# Patient Record
Sex: Female | Born: 1985 | Race: White | Hispanic: No | Marital: Married | Smoking: Never smoker
Health system: Southern US, Community
[De-identification: ages and names within clinical notes are randomized; demographics above are authoritative.]

## PROBLEM LIST (undated history)

## (undated) DIAGNOSIS — L719 Rosacea, unspecified: Secondary | ICD-10-CM

## (undated) DIAGNOSIS — M1990 Unspecified osteoarthritis, unspecified site: Secondary | ICD-10-CM

## (undated) DIAGNOSIS — E079 Disorder of thyroid, unspecified: Secondary | ICD-10-CM

## (undated) DIAGNOSIS — K900 Celiac disease: Secondary | ICD-10-CM

## (undated) DIAGNOSIS — F909 Attention-deficit hyperactivity disorder, unspecified type: Secondary | ICD-10-CM

## (undated) HISTORY — DX: Celiac disease: K90.0

## (undated) HISTORY — DX: Attention-deficit hyperactivity disorder, unspecified type: F90.9

## (undated) HISTORY — DX: Rosacea, unspecified: L71.9

## (undated) HISTORY — DX: Unspecified osteoarthritis, unspecified site: M19.90

## (undated) HISTORY — DX: Disorder of thyroid, unspecified: E07.9

## (undated) HISTORY — PX: GANGLION CYST EXCISION: SHX1691

---
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
## 2007-02-13 ENCOUNTER — Emergency Department (HOSPITAL_COMMUNITY): Admission: EM | Admit: 2007-02-13 | Discharge: 2007-02-13 | Payer: Self-pay | Admitting: Emergency Medicine

## 2007-02-25 ENCOUNTER — Ambulatory Visit: Payer: Self-pay | Admitting: Internal Medicine

## 2007-02-25 DIAGNOSIS — R079 Chest pain, unspecified: Secondary | ICD-10-CM | POA: Insufficient documentation

## 2007-02-25 DIAGNOSIS — R05 Cough: Secondary | ICD-10-CM | POA: Insufficient documentation

## 2007-03-24 ENCOUNTER — Ambulatory Visit: Payer: Self-pay | Admitting: Internal Medicine

## 2007-03-24 DIAGNOSIS — R0602 Shortness of breath: Secondary | ICD-10-CM | POA: Insufficient documentation

## 2007-03-29 ENCOUNTER — Telehealth (INDEPENDENT_AMBULATORY_CARE_PROVIDER_SITE_OTHER): Payer: Self-pay | Admitting: *Deleted

## 2007-04-01 ENCOUNTER — Encounter: Payer: Self-pay | Admitting: Internal Medicine

## 2007-04-01 ENCOUNTER — Ambulatory Visit (HOSPITAL_COMMUNITY): Admission: RE | Admit: 2007-04-01 | Discharge: 2007-04-01 | Payer: Self-pay | Admitting: Internal Medicine

## 2007-04-06 ENCOUNTER — Telehealth (INDEPENDENT_AMBULATORY_CARE_PROVIDER_SITE_OTHER): Payer: Self-pay | Admitting: *Deleted

## 2007-04-15 ENCOUNTER — Ambulatory Visit: Payer: Self-pay | Admitting: Internal Medicine

## 2007-04-15 DIAGNOSIS — J45909 Unspecified asthma, uncomplicated: Secondary | ICD-10-CM | POA: Insufficient documentation

## 2007-05-27 ENCOUNTER — Ambulatory Visit: Payer: Self-pay | Admitting: Internal Medicine

## 2007-07-05 ENCOUNTER — Ambulatory Visit: Payer: Self-pay | Admitting: Internal Medicine

## 2007-07-05 DIAGNOSIS — K219 Gastro-esophageal reflux disease without esophagitis: Secondary | ICD-10-CM | POA: Insufficient documentation

## 2008-02-03 ENCOUNTER — Telehealth: Payer: Self-pay | Admitting: Internal Medicine

## 2008-05-07 ENCOUNTER — Ambulatory Visit: Payer: Self-pay | Admitting: Adult Health

## 2008-07-09 ENCOUNTER — Ambulatory Visit: Payer: Self-pay | Admitting: Internal Medicine

## 2008-08-09 ENCOUNTER — Telehealth (INDEPENDENT_AMBULATORY_CARE_PROVIDER_SITE_OTHER): Payer: Self-pay | Admitting: *Deleted

## 2008-09-06 ENCOUNTER — Telehealth: Payer: Self-pay | Admitting: Internal Medicine

## 2008-09-18 ENCOUNTER — Encounter: Payer: Self-pay | Admitting: Internal Medicine

## 2009-09-20 ENCOUNTER — Ambulatory Visit: Payer: Self-pay | Admitting: Advanced Practice Midwife

## 2009-09-20 ENCOUNTER — Inpatient Hospital Stay (HOSPITAL_COMMUNITY)
Admission: AD | Admit: 2009-09-20 | Discharge: 2009-09-20 | Payer: Self-pay | Source: Home / Self Care | Admitting: Obstetrics & Gynecology

## 2009-10-16 IMAGING — CR DG CHEST 2V
2 series · 2 of 2 positions shown · non-contrast
Comparison: none

CLINICAL DATA: Trouble breathing.  
 CHEST ? 2 VIEW:

[w chest pa]
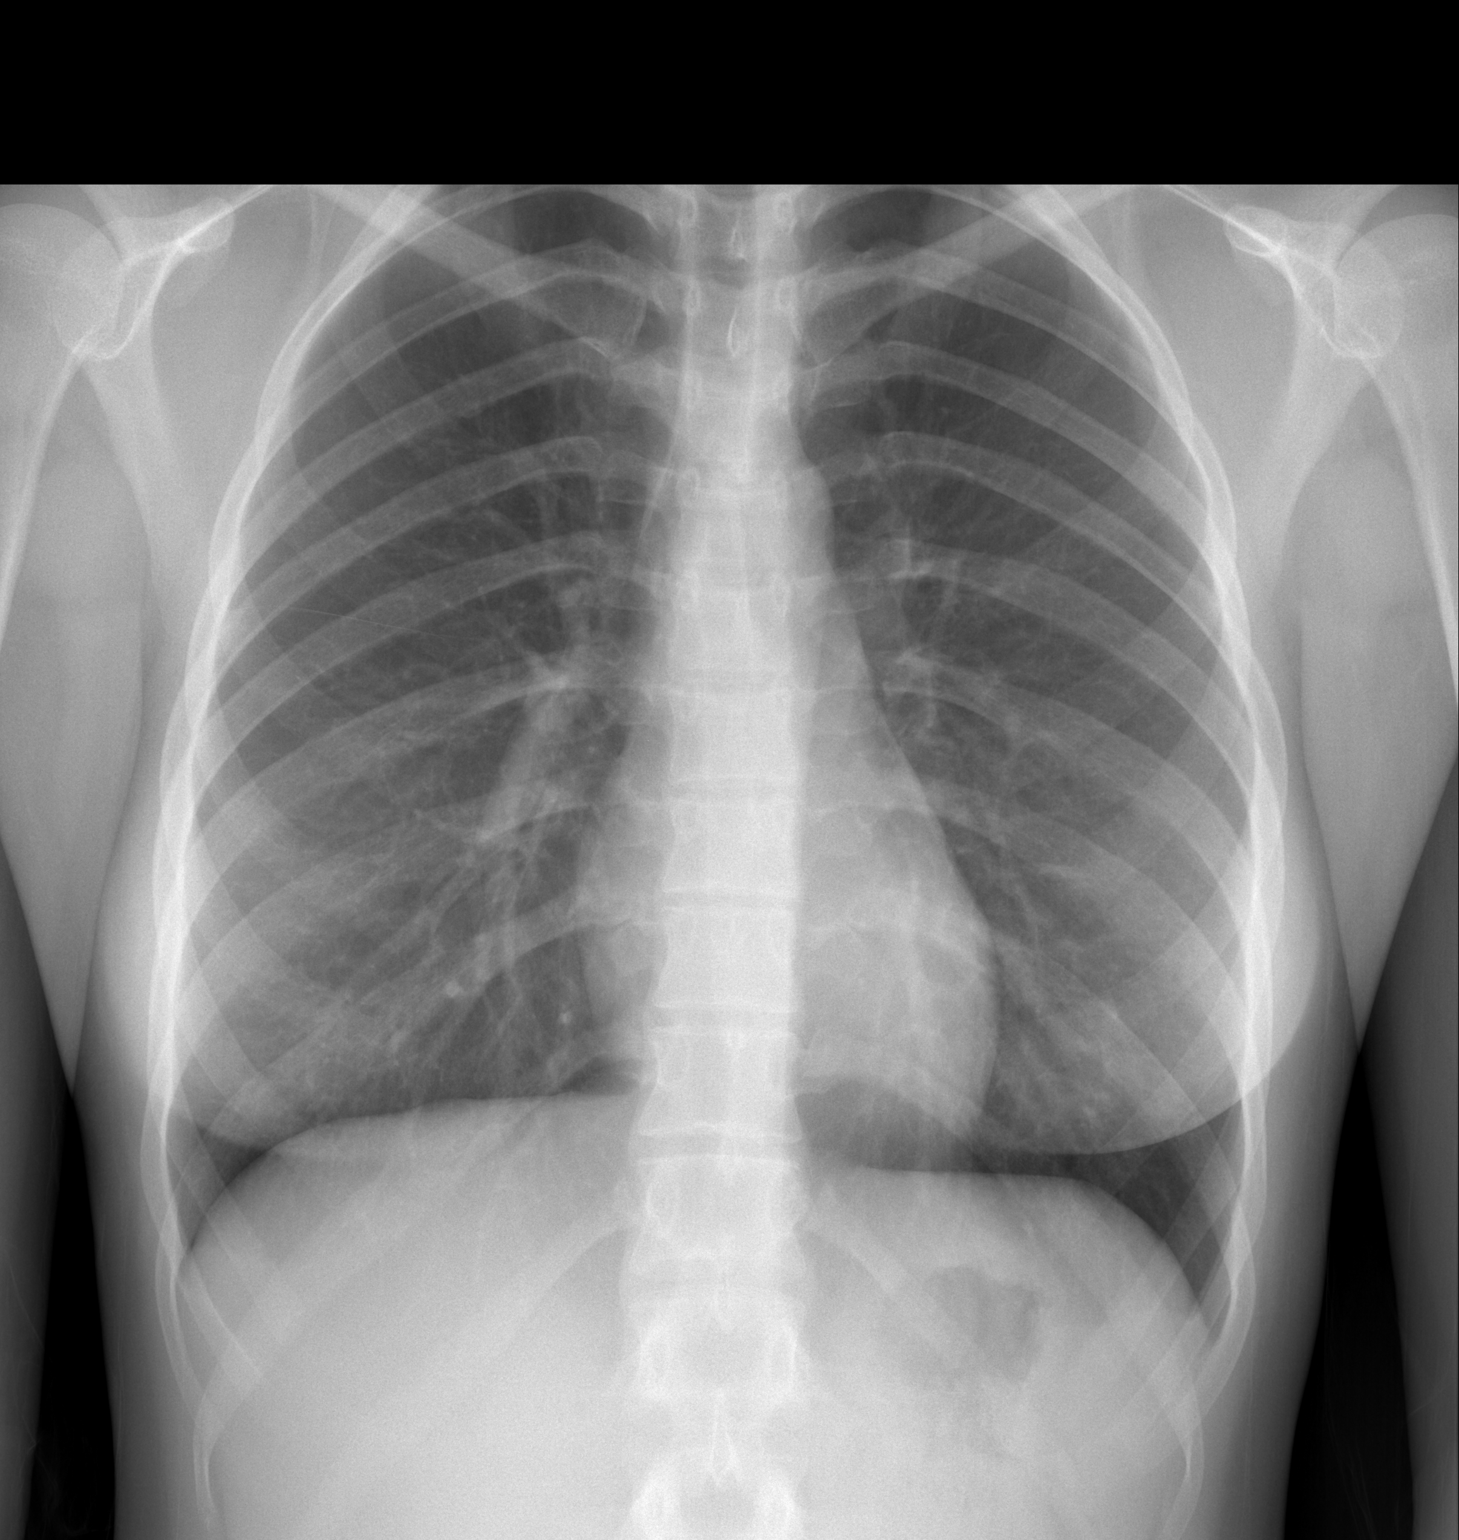

[w chest lat]
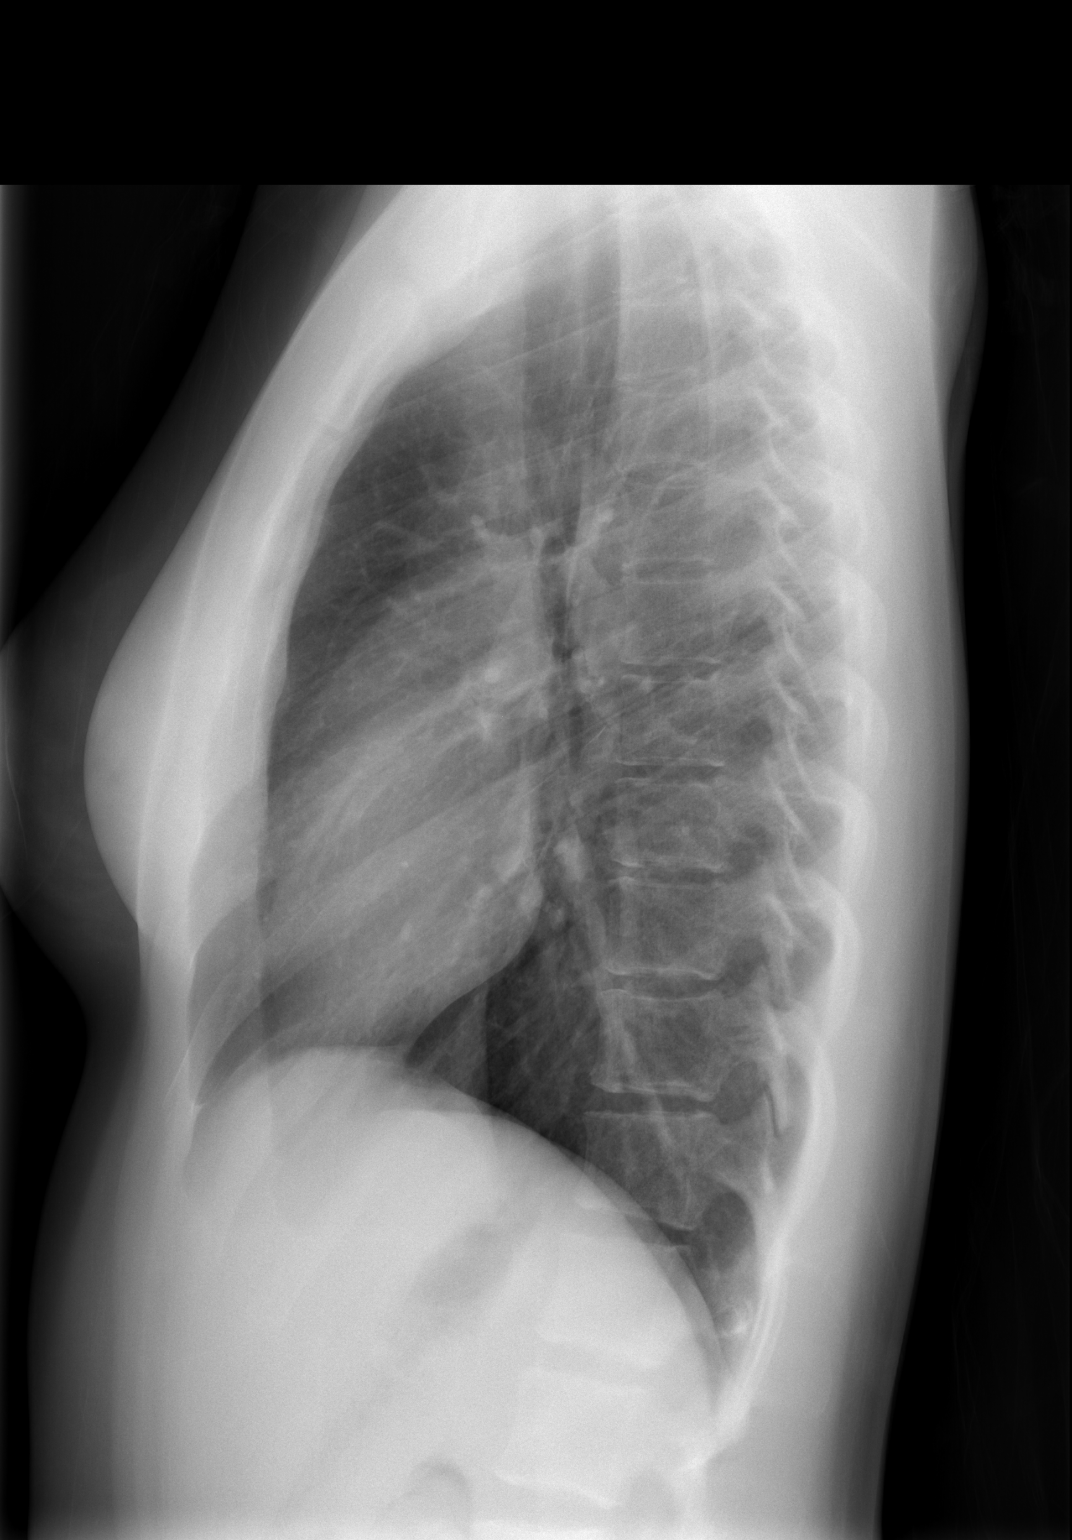

[2 of 2 positions shown; findings below may reference images not displayed]

FINDINGS: Normal mediastinum and cardiac silhouette.  The costophrenic angles are clear.  Normal pulmonary vasculature.  No effusion, infiltrate or pneumothorax.
IMPRESSION: No acute cardiopulmonary process.

## 2010-07-18 NOTE — Op Note (Signed)
Molly Todd, Molly Todd              ACCOUNT NO.:  [id number]   MEDICAL RECORD NO.:  [id number]          PATIENT TYPE:  AMB   LOCATION:  DSC                          FACILITY:  [facility]   PHYSICIAN:  Lubertha Basque. Dalldorf, M.D.DATE OF BIRTH:  03/11/1985   DATE OF PROCEDURE:  08/19/2004  DATE OF DISCHARGE:                                 OPERATIVE REPORT   PREOPERATIVE DIAGNOSIS:  Left wrist cyst.   POSTOPERATIVE DIAGNOSIS:  Left wrist cyst.   PROCEDURE:  Excision cyst left wrist.   ANESTHESIA:  General.   ATTENDING SURGEON:  Lubertha Basque. Jerl Santos, M.D.   INDICATIONS:  The patient is a 25 year old woman with a long history of a  cyst on the dorsal aspect of her left wrist. This persisted despite a brace  and observation. This causes her discomfort and limits her motion somewhat  and she would like to have it removed. She is offered an excision. After  informed operative consent was obtained along with discussion of possible  complications of reaction to anesthesia, infection, neurovascular injury and  recurrence.   DESCRIPTION OF PROCEDURE:  The patient was taken to the operating suite  where general anesthetic was applied without difficulty. She was positioned  supine and prepped and draped in normal sterile fashion. After  administration with preoperative IV antibiotics, the left arm was elevated,  exsanguinated, tourniquet inflated about her upper arm. A small transverse  incision was made over the cyst with dissection down the structure. She had  multiloculated cyst filled with gelatinous fluid consistent with a ganglion.  This was excised along with a long stalk that went down to the dorsal  capsule. The entire stalk plus a portion of the dorsal capsule was excised,  sent to pathology. The wound was irrigated followed by release of  tourniquet. The skin edges all became pink and warm immediately. A small  amount bleeding was easily controlled with pressure. Marcaine was injected  about  the incision site followed by closure of skin alone with vertical  mattresses of nylon. Adaptic was applied over the wound followed by dry  gauze and a volar splint of plaster with wrist in slight extension.  Estimated blood loss and fluids can be obtained from anesthesia records as  can accurate tourniquet time.   DISPOSITION:  The patient was extubated in operating room and taken to  recovery room in stable condition.   PLANS:  Were for her to go home the same day and follow up in the office in  less than a week. I will contact her by phone tonight.     PGD/MEDQ  D:  08/19/2004  T:  08/19/2004  Job:  [id number]

## 2010-12-08 LAB — D-DIMER, QUANTITATIVE: D-Dimer, Quant: <0.22

## 2010-12-08 LAB — BLOOD GAS, ARTERIAL
Acid-Base Excess: 4.8 — ABNORMAL HIGH
FIO2: 0.21
O2 Saturation: 98.3
TCO2: 23.4
pCO2 arterial: 36.4

## 2010-12-08 LAB — CBC: HCT: 43.9

## 2012-03-07 DIAGNOSIS — J4520 Mild intermittent asthma, uncomplicated: Secondary | ICD-10-CM | POA: Insufficient documentation

## 2012-03-14 DIAGNOSIS — O905 Postpartum thyroiditis: Secondary | ICD-10-CM | POA: Insufficient documentation

## 2012-04-06 DIAGNOSIS — R809 Proteinuria, unspecified: Secondary | ICD-10-CM | POA: Insufficient documentation

## 2012-05-16 DIAGNOSIS — E039 Hypothyroidism, unspecified: Secondary | ICD-10-CM | POA: Insufficient documentation

## 2015-03-03 DIAGNOSIS — M1380 Other specified arthritis, unspecified site: Secondary | ICD-10-CM | POA: Insufficient documentation

## 2015-09-17 DIAGNOSIS — N643 Galactorrhea not associated with childbirth: Secondary | ICD-10-CM | POA: Insufficient documentation

## 2017-05-28 DIAGNOSIS — K900 Celiac disease: Secondary | ICD-10-CM | POA: Insufficient documentation

## 2017-11-29 DIAGNOSIS — F32A Depression, unspecified: Secondary | ICD-10-CM | POA: Insufficient documentation

## 2018-02-10 DIAGNOSIS — G43109 Migraine with aura, not intractable, without status migrainosus: Secondary | ICD-10-CM | POA: Insufficient documentation

## 2019-08-05 LAB — HM PAP SMEAR: HM Pap smear: NORMAL

## 2019-08-05 LAB — RESULTS CONSOLE HPV: CHL HPV: NEGATIVE

## 2019-08-05 LAB — HM HEPATITIS C SCREENING LAB: HM Hepatitis Screen: NEGATIVE

## 2019-09-21 DIAGNOSIS — E042 Nontoxic multinodular goiter: Secondary | ICD-10-CM | POA: Insufficient documentation

## 2020-03-02 HISTORY — PX: BILATERAL SALPINGECTOMY: SHX5743

## 2020-06-25 DIAGNOSIS — U071 COVID-19: Secondary | ICD-10-CM | POA: Insufficient documentation

## 2020-10-03 DIAGNOSIS — F988 Other specified behavioral and emotional disorders with onset usually occurring in childhood and adolescence: Secondary | ICD-10-CM | POA: Insufficient documentation

## 2021-11-10 ENCOUNTER — Ambulatory Visit: Payer: 59 | Admitting: Family Medicine

## 2021-12-18 ENCOUNTER — Ambulatory Visit: Payer: 59 | Admitting: Family Medicine

## 2021-12-18 ENCOUNTER — Other Ambulatory Visit (HOSPITAL_COMMUNITY)
Admission: RE | Admit: 2021-12-18 | Discharge: 2021-12-18 | Disposition: A | Discharge status: Home / Self Care | Payer: 59 | Source: Ambulatory Visit | Attending: Family Medicine | Admitting: Family Medicine

## 2021-12-18 ENCOUNTER — Encounter: Payer: Self-pay | Admitting: Family Medicine

## 2021-12-18 ENCOUNTER — Telehealth: Payer: Self-pay

## 2021-12-18 VITALS — BP 120/78 | HR 72 | Ht 63.0 in | Wt 112.0 lb

## 2021-12-18 DIAGNOSIS — Z23 Encounter for immunization: Secondary | ICD-10-CM | POA: Diagnosis not present

## 2021-12-18 DIAGNOSIS — Z113 Encounter for screening for infections with a predominantly sexual mode of transmission: Secondary | ICD-10-CM

## 2021-12-18 DIAGNOSIS — E039 Hypothyroidism, unspecified: Secondary | ICD-10-CM

## 2021-12-18 NOTE — Telephone Encounter (Signed)
Called pt and sent message through [place]. She didn't get labs drawn- needing to know if this was not done on purpose or was there a misunderstanding. We are sending urine out.

## 2021-12-18 NOTE — Progress Notes (Addendum)
Date:  12/18/2021   Name:  Molly Todd   DOB:  11-Jan-1986   MRN:  [id number]   Chief Complaint: Establish Care (New to area from [facility]) and Flu Vaccine  Thyroid Problem Presents for follow-up visit. Symptoms include anxiety. Patient reports no cold intolerance, constipation, depressed mood, dry skin, hair loss, heat intolerance, menstrual problem, nail problem, palpitations, visual change, weight gain or weight loss. The symptoms have been stable.    No results found for: "NA", "K", "CO2", "GLUCOSE", "BUN", "CREATININE", "CALCIUM", "EGFR", "GFRNONAA" No results found for: "CHOL", "HDL", "LDLCALC", "LDLDIRECT", "TRIG", "CHOLHDL" No results found for: "TSH" No results found for: "HGBA1C" Lab Results  Component Value Date   WBC 17.3 (H) 02/13/2007   HGB 15.4 (H) 02/13/2007   HCT 43.9 02/13/2007   MCV 88.1 02/13/2007   PLT 436 (H) 02/13/2007   No results found for: "ALT", "AST", "GGT", "ALKPHOS", "BILITOT" No results found for: "25OHVITD2", "25OHVITD3", "VD25OH"   Review of Systems  Constitutional:  Negative for unexpected weight change, weight gain and weight loss.  HENT: Negative.    Eyes: Negative.   Respiratory:  Negative for chest tightness, shortness of breath, wheezing and stridor.   Cardiovascular: Negative.  Negative for palpitations.  Gastrointestinal: Negative.  Negative for abdominal pain, anal bleeding, constipation, nausea and rectal pain.  Endocrine: Negative for cold intolerance and heat intolerance.  Genitourinary:  Negative for difficulty urinating, frequency, menstrual problem, vaginal bleeding and vaginal pain.  Musculoskeletal:  Positive for arthralgias and myalgias.  Skin:  Positive for color change and rash.       ?raynauds  Allergic/Immunologic: Positive for immunocompromised state.  Neurological:  Positive for headaches.       Hx migraines  Hematological:  Negative for adenopathy. Does not bruise/bleed easily.  Psychiatric/Behavioral:   The patient is nervous/anxious.     Patient Active Problem List   Diagnosis Date Noted   G E REFLUX 07/05/2007   ASTHMA 04/15/2007   SHORTNESS OF BREATH 03/24/2007   COUGH 02/25/2007   CHEST PAIN-UNSPECIFIED 02/25/2007    Allergies  Allergen Reactions   Actical Other (See Comments)   Gluten Meal Other (See Comments)    Celiac   Thimerosal Itching and Other (See Comments)    Itchy red eyes in contact solution Itchy red eyes in contact solution     Past Surgical History:  Procedure Laterality Date   BILATERAL SALPINGECTOMY Bilateral 2022   with ablation   GANGLION CYST EXCISION Left    wrist    Social History   Tobacco Use   Smoking status: Never   Smokeless tobacco: Never  Vaping Use   Vaping Use: Never used  Substance Use Topics   Alcohol use: Yes    Comment: socially/ occasionally   Drug use: Never     Medication list has been reviewed and updated.  Current Meds  Medication Sig   Adalimumab (HUMIRA) 40 MG/0.4ML PSKT Inject into the skin. [facility]   albuterol (VENTOLIN HFA) 108 (90 Base) MCG/ACT inhaler Inhale into the lungs.   amphetamine-dextroamphetamine (ADDERALL) 10 MG tablet Take 20 mg by mouth 2 (two) times daily.   amphetamine-dextroamphetamine (ADDERALL) 20 MG tablet Take 20 mg by mouth 2 (two) times daily.   levothyroxine (SYNTHROID) 50 MCG tablet Take 50 mcg by mouth daily. pcp   sertraline (ZOLOFT) 50 MG tablet Take 50 mg by mouth daily.   ZILXI 1.5 % FOAM Apply topically. derm       12/18/2021  3:17 PM  GAD 7 : Generalized Anxiety Score  Nervous, Anxious, on Edge 3  Control/stop worrying 3  Worry too much - different things 0  Trouble relaxing 0  Restless 0  Easily annoyed or irritable 0  Afraid - awful might happen 1  Total GAD 7 Score 7  Anxiety Difficulty Not difficult at all       12/18/2021    3:16 PM  Depression screen PHQ 2/9  Decreased Interest 0  Down, Depressed, Hopeless 0  PHQ - 2 Score 0  Altered sleeping 0   Tired, decreased energy 0  Change in appetite 0  Feeling bad or failure about yourself  0  Trouble concentrating 0  Moving slowly or fidgety/restless 0  Suicidal thoughts 0  PHQ-9 Score 0  Difficult doing work/chores Not difficult at all    BP Readings from Last 3 Encounters:  12/18/21 120/78    Physical Exam Vitals and nursing note reviewed.  Constitutional:      Appearance: She is normal weight.  HENT:     Right Ear: Tympanic membrane normal.     Left Ear: Tympanic membrane normal.  Neck:     Thyroid: No thyroid mass, thyromegaly or thyroid tenderness.  Cardiovascular:     Rate and Rhythm: Normal rate and regular rhythm.     Heart sounds: No murmur heard.    No gallop.  Pulmonary:     Breath sounds: No wheezing or rhonchi.  Abdominal:     Palpations: There is no hepatomegaly or splenomegaly.     Tenderness: There is no guarding.  Musculoskeletal:     Cervical back: Neck supple.  Neurological:     Mental Status: She is alert.     Wt Readings from Last 3 Encounters:  12/18/21 112 lb (50.8 kg)    BP 120/78   Pulse 72   Ht 5' 3"  (1.6 m)   Wt 112 lb (50.8 kg)   SpO2 95%   BMI 19.84 kg/m   Assessment and Plan: 1. Hypothyroidism, unspecified type Chronic.  Controlled.  Stable.  Currently on levothyroxine 50 mcg daily we will check thyroid panel with TSH.  Pending TSH value we will determine whether we continue at current dosing of 50 mcg. - Thyroid Panel With TSH  2. Screen for STD (sexually transmitted disease) Chronic.  Controlled.  Stable.  We will screen for HIV/RPR/GC/chlamydia. - HIV Antibody (routine testing w rflx) - RPR - GC/Chlamydia Probe Amp - GC/Chlamydia probe amp ([place])not at [facility]  3. Need for immunization against influenza Discussed and administered. - Flu Vaccine QUAD 89moIM (Fluarix, Fluzone & Alfiuria Quad PF)    DOtilio Miu MD

## 2021-12-19 LAB — THYROID PANEL WITH TSH
Free Thyroxine Index: 2.5 (ref 1.2–4.9)
T3 Uptake Ratio: 28 % (ref 24–39)
T4, Total: 8.8 ug/dL (ref 4.5–12.0)
TSH: 3.19 u[IU]/mL (ref 0.450–4.500)

## 2021-12-19 LAB — HIV ANTIBODY (ROUTINE TESTING W REFLEX): HIV Screen 4th Generation wRfx: NONREACTIVE

## 2021-12-19 LAB — RPR: RPR Ser Ql: NONREACTIVE

## 2021-12-22 LAB — GC/CHLAMYDIA PROBE AMP (~~LOC~~) NOT AT ARMC
Chlamydia: NEGATIVE
Comment: NEGATIVE
Comment: NORMAL
Neisseria Gonorrhea: NEGATIVE

## 2024-02-07 ENCOUNTER — Ambulatory Visit: Admission: EM | Admit: 2024-02-07 | Discharge: 2024-02-07 | Disposition: A | Discharge status: Home / Self Care

## 2024-02-07 DIAGNOSIS — F419 Anxiety disorder, unspecified: Secondary | ICD-10-CM | POA: Insufficient documentation

## 2024-02-07 DIAGNOSIS — R109 Unspecified abdominal pain: Secondary | ICD-10-CM | POA: Diagnosis not present

## 2024-02-07 DIAGNOSIS — R809 Proteinuria, unspecified: Secondary | ICD-10-CM | POA: Insufficient documentation

## 2024-02-07 NOTE — Discharge Instructions (Addendum)
Please go to the emergency department for evaluation of your abdominal pain. 

## 2024-02-07 NOTE — ED Triage Notes (Signed)
 Pt c/o abd pain,N/V & loss of appetite x2 days. Hx of celiac. Denies fevers or any new foods. States unable to keep foods or fluids down. Has tried OTC meds w/o relief.

## 2024-02-07 NOTE — ED Provider Notes (Signed)
 [facility]    CSN: [id number] Arrival date & time: 02/07/24  1208      History   Chief Complaint Chief Complaint  Patient presents with   Abdominal Pain   Nausea    HPI Molly Todd is a 38 y.o. female.   HPI  38 year old female with past medical history significant for ADHD, arthritis, celiac disease, rosacea, thyroid  disease presents for evaluation of GI symptoms that began 3 days ago with nausea and then developed 2 nausea and vomiting and now progressed to dry heaves.  She also endorses decreased appetite.  She has consumed approximately half a bottle of Gatorade in the last 2 days.  No diarrhea or fever.  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   COVID-19 06/25/2020   Multiple thyroid  nodules 09/21/2019   Migraine with aura, not intractable 02/10/2018   Depression 11/29/2017   Celiac disease 05/28/2017   Galactorrhea on both sides 09/17/2015   Chronic inflammatory arthritis 03/03/2015   Hypothyroidism 05/16/2012   Proteinuria of undiagnosed cause 04/06/2012   Postpartum thyroiditis 03/14/2012   Mild intermittent asthma 03/07/2012   G E REFLUX 07/05/2007   ASTHMA 04/15/2007   SHORTNESS OF BREATH 03/24/2007   COUGH 02/25/2007   CHEST PAIN-UNSPECIFIED 02/25/2007    Past Surgical History:  Procedure Laterality Date   BILATERAL SALPINGECTOMY Bilateral 2022   with ablation   GANGLION CYST EXCISION Left    wrist    OB History   No obstetric history on file.      Home Medications    Prior to Admission medications   Medication Sig Start Date End Date Taking? Authorizing Provider  gabapentin (NEURONTIN) 100 MG capsule Take 100-300 mg by mouth 3 (three) times daily. 09/30/23  Yes [provider]  Adalimumab (HUMIRA) 40 MG/0.4ML PSKT Inject into the skin. [facility] 08/11/21 08/11/22  [provider]  albuterol (VENTOLIN HFA) 108 (90 Base) MCG/ACT inhaler Inhale into the lungs. 12/26/14   [provider]  amphetamine-dextroamphetamine (ADDERALL) 10 MG tablet Take 20 mg by mouth 2 (two) times daily. 12/08/21   [provider]  amphetamine-dextroamphetamine (ADDERALL) 20 MG tablet Take 20 mg by mouth 2 (two) times daily. 08/29/21   [provider]  buPROPion (WELLBUTRIN XL) 150 MG 24 hr tablet Take 150 mg by mouth daily.    [provider]  levothyroxine (SYNTHROID) 50 MCG tablet Take 50 mcg by mouth daily. pcp 11/10/21   [provider]  sertraline (ZOLOFT) 50 MG tablet Take 50 mg by mouth daily. 12/08/21   [provider]  ZILXI 1.5 % FOAM Apply topically. derm 11/10/21   [provider]    Family History Family History  Problem Relation Age of Onset   Cancer Maternal Grandmother    Cancer Maternal Grandfather    Cancer Paternal Grandmother    Hypertension Paternal Grandfather     Social History Social History   Tobacco Use   Smoking status: Never   Smokeless tobacco: Never  Vaping Use   Vaping status: Never Used  Substance Use Topics   Alcohol use: Yes    Comment: socially/ occasionally   Drug use: Never     Allergies  Actical, Gluten meal, and Thimerosal   Review of Systems Review of Systems  Constitutional:  Positive for appetite change. Negative for fever.  Gastrointestinal:  Positive for abdominal pain, nausea and vomiting. Negative for diarrhea.  Endocrine:       Decreased urine output     Physical Exam Triage Vital Signs ED Triage Vitals  Encounter Vitals Group     BP      Girls Systolic BP Percentile      Girls Diastolic BP Percentile      Boys Systolic BP Percentile      Boys Diastolic BP Percentile      Pulse      Resp      Temp      Temp src      SpO2      Weight       Height      Head Circumference      Peak Flow      Pain Score      Pain Loc      Pain Education      Exclude from Growth Chart    No data found.  Updated Vital Signs BP 115/81 (BP Location: Right Arm)   Pulse 80   Temp 98.3 F (36.8 C) (Oral)   Resp 16   Wt 119 lb 6.4 oz (54.2 kg)   SpO2 98%   BMI 21.15 kg/m   Visual Acuity Right Eye Distance:   Left Eye Distance:   Bilateral Distance:    Right Eye Near:   Left Eye Near:    Bilateral Near:     Physical Exam Vitals and nursing note reviewed.  Constitutional:      Appearance: Normal appearance. She is ill-appearing.  HENT:     Head: Normocephalic and atraumatic.  Cardiovascular:     Rate and Rhythm: Normal rate and regular rhythm.     Pulses: Normal pulses.     Heart sounds: Normal heart sounds. No murmur heard.    No friction rub. No gallop.  Pulmonary:     Effort: Pulmonary effort is normal.     Breath sounds: Normal breath sounds. No wheezing, rhonchi or rales.  Abdominal:     General: Abdomen is flat.     Tenderness: There is abdominal tenderness. There is guarding. There is no rebound.  Skin:    General: Skin is warm and dry.     Capillary Refill: Capillary refill takes less than 2 seconds.     Findings: No rash.  Neurological:     General: No focal deficit present.     Mental Status: She is alert and oriented to person, place, and time.      UC Treatments / Results  Labs (all labs ordered are listed, but only abnormal results are displayed) Labs Reviewed - No data to display  EKG   Radiology No results found.  Procedures Procedures (including critical care time)  Medications Ordered in UC Medications - No data to display  Initial Impression / Assessment and Plan / UC Course  I have reviewed the triage vital signs and the nursing notes.  Pertinent labs & imaging results that were available during my care of the patient were reviewed by me and considered in my medical decision making  (see chart for details).   Patient is a pleasant, though moderately ill-appearing, 38 year old female presenting for evaluation of GI symptoms outlined HPI above.  She reports that she has only consumed about a half a bottle of  Gatorade in the last 2 days as she has not been able to keep down a lot in the way of fluids or food.  She is no longer vomiting but continues to have dry heaves.  On exam, her abdomen is soft and flat with tenderness in the upper abdomen as well as the periumbilical region and over McBurney's point.  She does demonstrate guarding.  Differential diagnosis include cholecystitis, pancreatitis, appendicitis, colitis.  I have advised the patient that we do not have the ability to check labs here in clinic and we also do not have the ability to advanced imaging today.  I have referred her to the emergency department and she and her husband have elected to go to [facility].   Final Clinical Impressions(s) / UC Diagnoses   Final diagnoses:  Abdominal pain, unspecified abdominal location     Discharge Instructions      Please go to the emergency department for evaluation of your abdominal pain.     ED Prescriptions   None    PDMP not reviewed this encounter.   Bernardino Ditch, NP 02/07/24 1257

## 2024-02-18 ENCOUNTER — Other Ambulatory Visit: Payer: Self-pay | Admitting: Medical Genetics
